# Patient Record
Sex: Female | Born: 2000 | Race: White | Hispanic: No | Marital: Single | Smoking: Never smoker
Health system: Southern US, Community
[De-identification: ages and names within clinical notes are randomized; demographics above are authoritative.]

## PROBLEM LIST (undated history)

## (undated) HISTORY — PX: APPENDECTOMY: SHX54

---

## 2018-04-16 ENCOUNTER — Emergency Department
Admission: EM | Admit: 2018-04-16 | Discharge: 2018-04-16 | Disposition: A | Discharge status: Home / Self Care | Payer: 59 | Attending: Emergency Medicine | Admitting: Emergency Medicine

## 2018-04-16 ENCOUNTER — Other Ambulatory Visit: Payer: Self-pay

## 2018-04-16 ENCOUNTER — Encounter: Payer: Self-pay | Admitting: Emergency Medicine

## 2018-04-16 ENCOUNTER — Emergency Department: Payer: 59

## 2018-04-16 DIAGNOSIS — R569 Unspecified convulsions: Secondary | ICD-10-CM | POA: Insufficient documentation

## 2018-04-16 LAB — CBC WITH DIFFERENTIAL/PLATELET
Abs Immature Granulocytes: 0.03 10*3/uL (ref 0.00–0.07)
Basophils Absolute: 0 10*3/uL (ref 0.0–0.1)
Basophils Relative: 1 %
Eosinophils Absolute: 0.1 10*3/uL (ref 0.0–0.5)
Eosinophils Relative: 1 %
HEMATOCRIT: 43 % (ref 36.0–46.0)
Hemoglobin: 15 g/dL (ref 12.0–15.0)
Immature Granulocytes: 1 %
Lymphocytes Relative: 30 %
Lymphs Abs: 1.7 10*3/uL (ref 0.7–4.0)
MCH: 30.5 pg (ref 26.0–34.0)
MCHC: 34.9 g/dL (ref 30.0–36.0)
MCV: 87.6 fL (ref 80.0–100.0)
Monocytes Absolute: 0.4 10*3/uL (ref 0.1–1.0)
Monocytes Relative: 7 %
NEUTROS ABS: 3.4 10*3/uL (ref 1.7–7.7)
Neutrophils Relative %: 60 %
Platelets: 227 10*3/uL (ref 150–400)
RBC: 4.91 MIL/uL (ref 3.87–5.11)
RDW: 11.6 % (ref 11.5–15.5)
WBC: 5.6 10*3/uL (ref 4.0–10.5)
nRBC: 0 % (ref 0.0–0.2)

## 2018-04-16 LAB — URINE DRUG SCREEN, QUALITATIVE (ARMC ONLY)
AMPHETAMINES, UR SCREEN: NOT DETECTED
Barbiturates, Ur Screen: NOT DETECTED
Benzodiazepine, Ur Scrn: NOT DETECTED
Cannabinoid 50 Ng, Ur ~~LOC~~: NOT DETECTED
Cocaine Metabolite,Ur ~~LOC~~: NOT DETECTED
MDMA (Ecstasy)Ur Screen: NOT DETECTED
METHADONE SCREEN, URINE: NOT DETECTED
Opiate, Ur Screen: NOT DETECTED
Phencyclidine (PCP) Ur S: NOT DETECTED
TRICYCLIC, UR SCREEN: NOT DETECTED

## 2018-04-16 LAB — COMPREHENSIVE METABOLIC PANEL
ALT: 54 U/L — ABNORMAL HIGH (ref 0–44)
AST: 27 U/L (ref 15–41)
Albumin: 4.6 g/dL (ref 3.5–5.0)
Alkaline Phosphatase: 53 U/L (ref 38–126)
Anion gap: 8 (ref 5–15)
BUN: 19 mg/dL (ref 6–20)
CO2: 26 mmol/L (ref 22–32)
Calcium: 9.7 mg/dL (ref 8.9–10.3)
Chloride: 103 mmol/L (ref 98–111)
Creatinine, Ser: 0.99 mg/dL (ref 0.44–1.00)
GFR calc Af Amer: >60 mL/min (ref >60)
GFR calc non Af Amer: >60 mL/min (ref >60)
Glucose, Bld: 108 mg/dL — ABNORMAL HIGH (ref 70–99)
Potassium: 3.8 mmol/L (ref 3.5–5.1)
Sodium: 137 mmol/L (ref 135–145)
Total Bilirubin: 1.2 mg/dL (ref 0.3–1.2)
Total Protein: 8.1 g/dL (ref 6.5–8.1)

## 2018-04-16 LAB — URINALYSIS, COMPLETE (UACMP) WITH MICROSCOPIC
Bilirubin Urine: NEGATIVE
Glucose, UA: NEGATIVE mg/dL
Hgb urine dipstick: NEGATIVE
Ketones, ur: 20 mg/dL — AB
Leukocytes,Ua: NEGATIVE
Nitrite: NEGATIVE
Protein, ur: NEGATIVE mg/dL
Specific Gravity, Urine: 1.016 (ref 1.005–1.030)
pH: 6 (ref 5.0–8.0)

## 2018-04-16 LAB — POCT PREGNANCY, URINE: Preg Test, Ur: NEGATIVE

## 2018-04-16 LAB — ETHANOL: Alcohol, Ethyl (B): <10 mg/dL (ref <10)

## 2018-04-16 MED ORDER — SODIUM CHLORIDE 0.9 % IV SOLN
Freq: Once | INTRAVENOUS | Status: AC
  Administered 2018-04-16: 10:00:00 via INTRAVENOUS

## 2018-04-16 NOTE — ED Notes (Signed)
Pt unsuccessful to obtain urine specimen at this time. Will continue to monitor for sample

## 2018-04-16 NOTE — ED Triage Notes (Signed)
PT via EMS from [place], states witnessed seizure. No hx. VSS. PT A&OX4, MD at bedside

## 2018-04-16 NOTE — ED Provider Notes (Signed)
[facility] Emergency Department Provider Note       Time seen: ----------------------------------------- 9:47 AM on 04/16/2018 -----------------------------------------   I have reviewed the triage vital signs and the nursing notes.  HISTORY   Chief Complaint Seizures    HPI Jean Price is a 18 y.o. female with no significant past medical history who presents to the ED for a seizure.  EMS reports it was related that she seized for about 20 minutes.  She was not incontinent, did not bite her tongue.  She has never had seizures before.  She denies any recent illness.  Reports she has had poor sleep recently.  History reviewed. No pertinent past medical history.  There are no active problems to display for this patient.   Past Surgical History:  Procedure Laterality Date  . APPENDECTOMY      Allergies Patient has no known allergies.  Social History Social History   Tobacco Use  . Smoking status: Never Smoker  . Smokeless tobacco: Never Used  Substance Use Topics  . Alcohol use: Never    Frequency: Never  . Drug use: Never   Review of Systems Constitutional: Negative for fever. Cardiovascular: Negative for chest pain. Respiratory: Negative for shortness of breath. Gastrointestinal: Negative for abdominal pain, vomiting and diarrhea. Musculoskeletal: Negative for back pain. Skin: Negative for rash. Neurological: Positive for seizure  All systems negative/normal/unremarkable except as stated in the HPI  ____________________________________________   PHYSICAL EXAM:  VITAL SIGNS: ED Triage Vitals  Enc Vitals Group     BP 04/16/18 0942 (!) 147/95     Pulse Rate 04/16/18 0942 (!) 55     Resp 04/16/18 0942 16     Temp 04/16/18 0942 97.8 F (36.6 C)     Temp Source 04/16/18 0942 Oral     SpO2 04/16/18 0942 100 %     Weight --      Height --      Head Circumference --      Peak Flow --      Pain Score 04/16/18 0943 0     Pain  Loc --      Pain Edu? --      Excl. in GC? --    Constitutional: Alert and oriented. Well appearing and in no distress. Eyes: Conjunctivae are normal. Normal extraocular movements. ENT      Head: Normocephalic and atraumatic.      Nose: No congestion/rhinnorhea.      Mouth/Throat: Mucous membranes are moist.  No trauma visualized      Neck: No stridor. Cardiovascular: Normal rate, regular rhythm. No murmurs, rubs, or gallops. Respiratory: Normal respiratory effort without tachypnea nor retractions. Breath sounds are clear and equal bilaterally. No wheezes/rales/rhonchi. Gastrointestinal: Soft and nontender. Normal bowel sounds Musculoskeletal: Nontender with normal range of motion in extremities. No lower extremity tenderness nor edema. Neurologic:  Normal speech and language. No gross focal neurologic deficits are appreciated.  Skin:  Skin is warm, dry and intact. No rash noted. Psychiatric: Mood and affect are normal. Speech and behavior are normal.  ____________________________________________  EKG: Interpreted by me.  Sinus rhythm the rate is 58 bpm, normal PR interval, normal QRS, normal QT  ____________________________________________  ED COURSE:  As part of my medical decision making, I reviewed the following data within the electronic MEDICAL RECORD NUMBER History obtained from family if available, nursing notes, old chart and ekg, as well as notes from prior ED visits. Patient presented for seizure, we will assess with labs  and imaging as indicated at this time.   Procedures ____________________________________________   LABS (pertinent positives/negatives)  Labs Reviewed  COMPREHENSIVE METABOLIC PANEL - Abnormal; Notable for the following components:      Result Value   Glucose, Bld 108 (*)    ALT 54 (*)    All other components within normal limits  CBC WITH DIFFERENTIAL/PLATELET  ETHANOL  URINALYSIS, COMPLETE (UACMP) WITH MICROSCOPIC  URINE DRUG SCREEN, QUALITATIVE  ([facility] ONLY)  POCT PREGNANCY, URINE  CBG MONITORING, ED  POC URINE PREG, ED    RADIOLOGY Images were viewed by me  CT head IMPRESSION: Study within normal limits. ____________________________________________   DIFFERENTIAL DIAGNOSIS   Seizure, arrhythmia, pregnancy, dehydration, electrolyte abnormality, substance abuse  FINAL ASSESSMENT AND PLAN  Seizure   Plan: The patient had presented for new onset seizure. Patient's labs did not reveal any acute process. Patient's imaging was also reassuring.  Seizure was likely multifactorial.  She looks well and is neurologically intact.  She is cleared for outpatient follow-up.   Ulice Dash, MD    Note: This note was generated in part or whole with voice recognition software. Voice recognition is usually quite accurate but there are transcription errors that can and very often do occur. I apologize for any typographical errors that were not detected and corrected.     Emily Filbert, MD 04/16/18 1228

## 2018-04-16 NOTE — ED Notes (Signed)
Patient transported to CT 

## 2018-04-18 ENCOUNTER — Ambulatory Visit (INDEPENDENT_AMBULATORY_CARE_PROVIDER_SITE_OTHER): Payer: Commercial Managed Care - PPO | Admitting: Family Medicine

## 2018-04-18 VITALS — BP 139/86 | HR 46 | Temp 97.5°F | Resp 14

## 2018-04-18 DIAGNOSIS — R55 Syncope and collapse: Secondary | ICD-10-CM

## 2018-04-18 DIAGNOSIS — R569 Unspecified convulsions: Secondary | ICD-10-CM

## 2018-04-19 ENCOUNTER — Other Ambulatory Visit: Payer: Self-pay | Admitting: Family Medicine

## 2018-04-19 DIAGNOSIS — R55 Syncope and collapse: Secondary | ICD-10-CM

## 2018-04-19 DIAGNOSIS — R569 Unspecified convulsions: Secondary | ICD-10-CM

## 2018-05-01 NOTE — Progress Notes (Signed)
Cardiology Office Note   Date:  05/02/2018   ID:  Jean Price, DOB 2000/04/03, MRN [id number]  PCP:  Patient, No Pcp Per  Cardiologist:  Lorine Bears, MD  Chief Complaint  Patient presents with  . other    Ref by Dr. Allena Katz for seizure like activity and syncope, the patient is on a softball scholarship at [employer]. Meds reviewed by the pt. verbally. "doing well."       History of Present Illness: Jean Price is a 18 y.o. female who is referred by Dr. Jolene Provost, MD for seizure-like activity and syncope. She has no significant past medical history. She presented to the ED on 04/16/2018 for a seizure that lasted about 20 minutes. Her labs did not reveal any acute process and her imaging was reassuring.  The patient is doing well today. She has no prior history of seizures. During class in the morning she felt sick, chest tightness, and then blacked which caused her to fall and hit her head. The seizure happened only for a few seconds. She was confused afterwards, but was aware of what had just occurred. She felt nauseous after the episode and vomited a couple of times. After the seizure she has been fine. She was not put on any seizure medication by her provider until further examination. She has no difficulty exercising for softball. Her mother has epilepsy. She has been on spironolactone for 3 months and is on oral contraceptive. Non-smoker and drinks alcohol occasionally. She has never had any cardiac tests done. She denies chest pain, SOB, palpitations or any other related symptoms or complaints at this time.  She was seen by Dr. Sherryll Burger yesterday and is suspected of having complex partial seizure with secondary generalization.  She is supposed to get an EEG and brain MRI.   History reviewed. No pertinent past medical history.  Past Surgical History:  Procedure Laterality Date  . APPENDECTOMY       Current Outpatient Medications  Medication Sig Dispense Refill  . LO  LOESTRIN FE 1 MG-10 MCG / 10 MCG tablet Take 1 tablet by mouth daily.    Marland Kitchen spironolactone (ALDACTONE) 50 MG tablet Take 50 mg by mouth 2 (two) times daily.     No current facility-administered medications for this visit.     Allergies:   Patient has no known allergies.    Social History:  The patient  reports that she has never smoked. She has never used smokeless tobacco. She reports current alcohol use. She reports that she does not use drugs.   Family History:  The patient's family history is not on file.    ROS:  Please see the history of present illness.   Otherwise, review of systems are positive for none.   All other systems are reviewed and negative.    PHYSICAL EXAM: VS:  BP 110/72 (BP Location: Right Arm, Patient Position: Sitting, Cuff Size: Normal)   Pulse 60   Ht 5\' 3"  (1.6 m)   Wt 135 lb 12 oz (61.6 kg)   SpO2 98%   BMI 24.05 kg/m  , BMI Body mass index is 24.05 kg/m. GEN: Well nourished, well developed, in no acute distress  HEENT: normal  Neck: no JVD, carotid bruits, or masses Cardiac: RRR; no murmurs, rubs, or gallops,no edema  Respiratory:  clear to auscultation bilaterally, normal work of breathing GI: soft, nontender, nondistended, + BS MS: no deformity or atrophy  Skin: warm and dry, no rash Neuro:  Strength  and sensation are intact Psych: euthymic mood, full affect   EKG:  EKG is ordered today. The ekg ordered today demonstrates normal sinus rhythm with no significant ST or T wave changes.  Normal PR and QT interval.   Recent Labs: 04/16/2018: ALT 54; BUN 19; Creatinine, Ser 0.99; Hemoglobin 15.0; Platelets 227; Potassium 3.8; Sodium 137    Lipid Panel No results found for: CHOL, TRIG, HDL, CHOLHDL, VLDL, LDLCALC, LDLDIRECT    Wt Readings from Last 3 Encounters:  05/02/18 135 lb 12 oz (61.6 kg)       ASSESSMENT AND PLAN:  1.  Loss of consciousness: All the indications point towards seizures and not a true cardiac syncope.  Her cardiac  exam is normal and baseline EKG is unremarkable.  There is a very small chance of arrhythmia causing seizure-like activities.  Due to that, I am going to obtain a 2-week outpatient monitor.  Given the normal cardiac exam and EKG, no need for an echocardiogram.  2.  Possible seizures: Continue follow-up with Dr. Sherryll Burger as planned.  3.  I do not see any reason to restrict the patient's activities or participation in sports.  She is cleared from a cardiac standpoint to resume.     Disposition:   FU with me in PRN  I, Jesus Reyes am acting as a Neurosurgeon for Lorine Bears, M.D.  I have reviewed the above documentation for accuracy and completeness, and I agree with the above.   Signed, Lorine Bears, MD 05/02/18 [facility] [place], [place] [phone]

## 2018-05-02 ENCOUNTER — Encounter: Payer: Self-pay | Admitting: Cardiovascular Disease

## 2018-05-02 ENCOUNTER — Ambulatory Visit (INDEPENDENT_AMBULATORY_CARE_PROVIDER_SITE_OTHER): Payer: 59 | Admitting: Cardiovascular Disease

## 2018-05-02 ENCOUNTER — Ambulatory Visit (INDEPENDENT_AMBULATORY_CARE_PROVIDER_SITE_OTHER): Payer: 59

## 2018-05-02 ENCOUNTER — Other Ambulatory Visit: Payer: Self-pay

## 2018-05-02 ENCOUNTER — Encounter

## 2018-05-02 VITALS — BP 110/72 | HR 60 | Ht 63.0 in | Wt 135.8 lb

## 2018-05-02 DIAGNOSIS — R55 Syncope and collapse: Secondary | ICD-10-CM

## 2018-05-02 NOTE — Patient Instructions (Addendum)
Medication Instructions:  No changes If you need a refill on your cardiac medications before your next appointment, please call your pharmacy.   Lab work: None ordered  Testing/Procedures: Your physician has recommended that you wear a 14 day Zio AT event monitor. Event monitors are medical devices that record the heart's electrical activity. Doctors most often [place] these monitors to diagnose arrhythmias. Arrhythmias are problems with the speed or rhythm of the heartbeat. The monitor is a small, portable device. You can wear one while you do your normal daily activities. This is usually used to diagnose what is causing palpitations/syncope (passing out).  This will be mailed to your home address within a few days. Please call us at [phone] if you have not heard from them.    Follow-Up: Follow up as needed with Dr. Kirke Corin.

## 2018-08-07 NOTE — Progress Notes (Signed)
Patient presents with symptoms of a syncopal episode while seated during class on Tuesday. Apparently, there was seizure-like movement for several movements when she was on the ground. She denies loss of bowel/bladder during the episode. She denies any history of syncope or seizures in the past. She denies any recent head injury. Does have a family history of epilepsy. Denies illicit drug use or alcohol use. Denies any cardiac problems in the past. No sudden cardiac death in the family. Patient does admit that she has felt some fatigue since the event. Denies any significant headache, nausea, vomiting.  Patient was seen in the ER an a CT head and labs were done. These were reviewed today.   ROS: Negative except mentioned above. Vitals as per Epic.  GENERAL: NAD HEENT: no pharyngeal erythema, no exudate, no erythema of TMs, no cervical LAD RESP: CTA B CARD: RRR, no m/r/g appreciated  NEURO: CN II-XII grossly intact, -Rombergs  A/P: Syncopal Episode vs Seizure - would recommend cardiac and neurology evaluation, encouraged patient not to drive for now given possible seizure, no athletic activity for now, discussed plan with trainer, seek medical attention if any persistent or worsening symptoms as discussed.

## 2018-11-13 ENCOUNTER — Other Ambulatory Visit: Payer: Self-pay

## 2018-11-13 DIAGNOSIS — Z20822 Contact with and (suspected) exposure to covid-19: Secondary | ICD-10-CM

## 2018-11-15 LAB — NOVEL CORONAVIRUS, NAA: SARS-CoV-2, NAA: NOT DETECTED

## 2020-03-16 IMAGING — CT CT HEAD W/O CM
3 series · 16 of 47 positions shown, 19 images · non-contrast
Comparison: None.

CLINICAL DATA: Syncope with fall.  Questionable seizure

EXAM:
CT HEAD WITHOUT CONTRAST
TECHNIQUE: Contiguous axial images were obtained from the base of the skull
through the vertex without intravenous contrast.

[Series 2: head wo · axial · 0.42mm/px · z∈[+446,+571]mm · 10 of 30 slices shown, 13 images]
[im 3/30  brain]
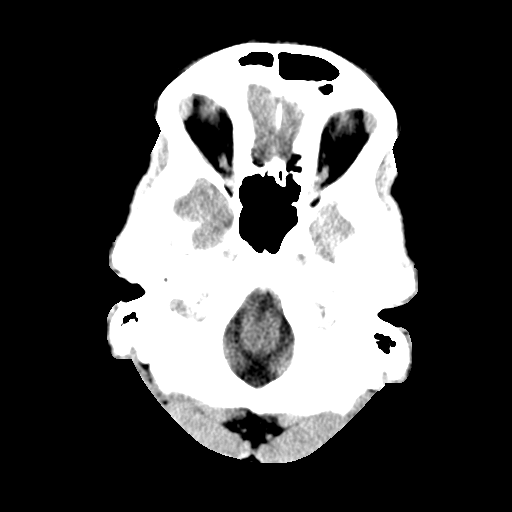
[im 3/30  bone]
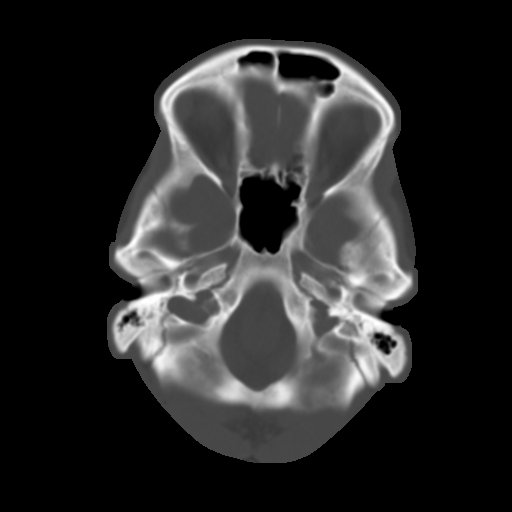
[im 6/30  brain]
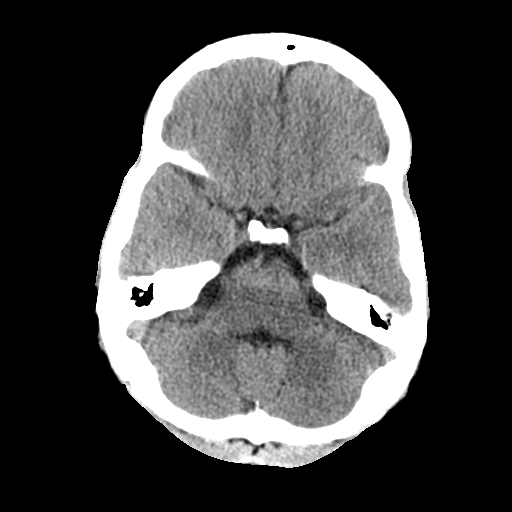
[im 9/30  brain]
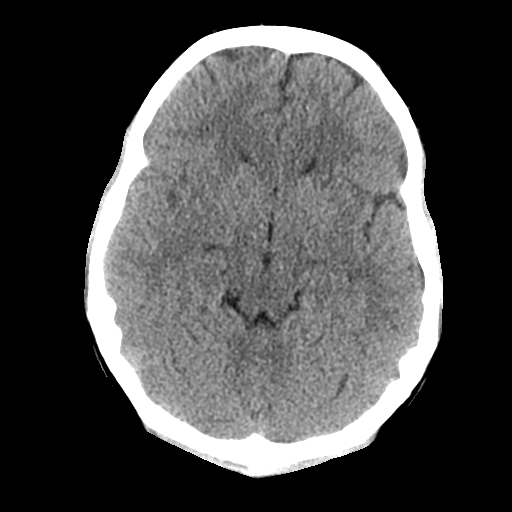
[im 11/30  brain]
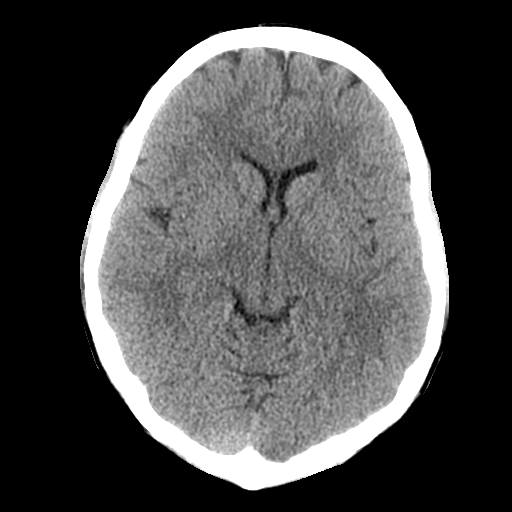
[im 14/30  brain]
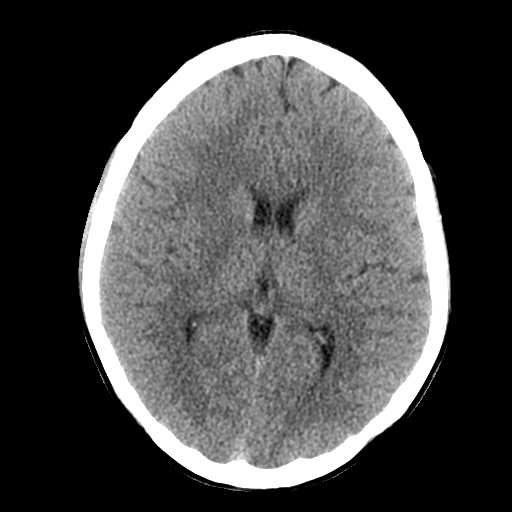
[im 14/30  bone]
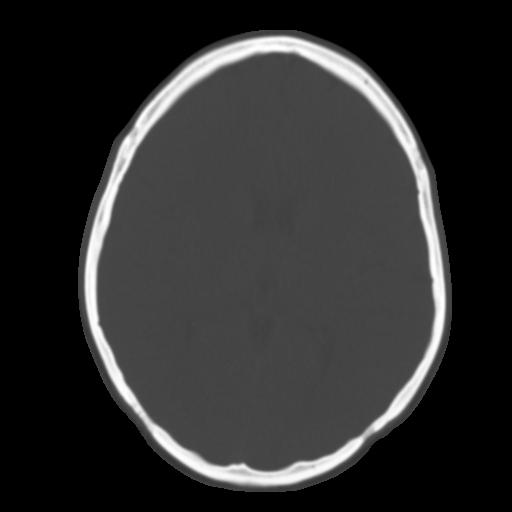
[im 17/30  brain]
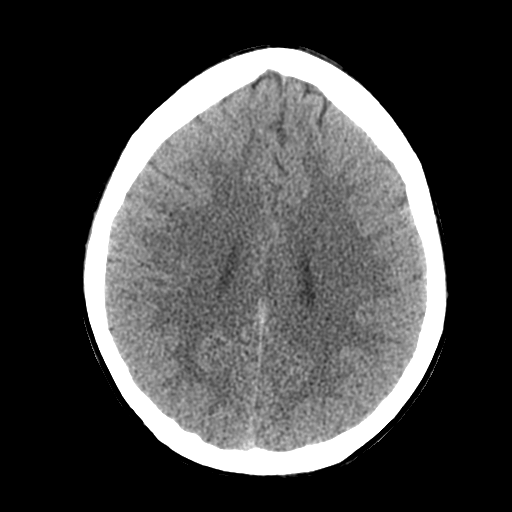
[im 20/30  brain]
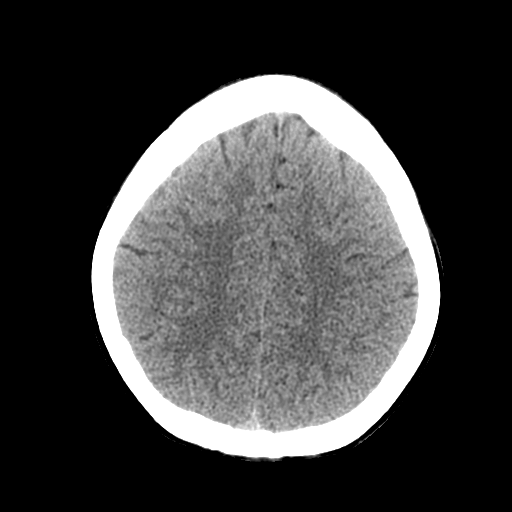
[im 23/30  brain]
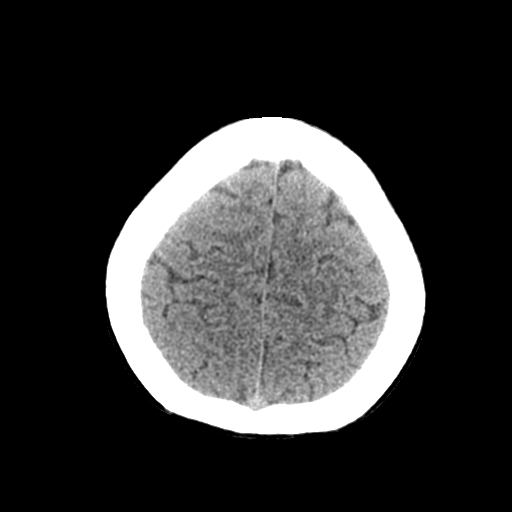
[im 25/30  brain]
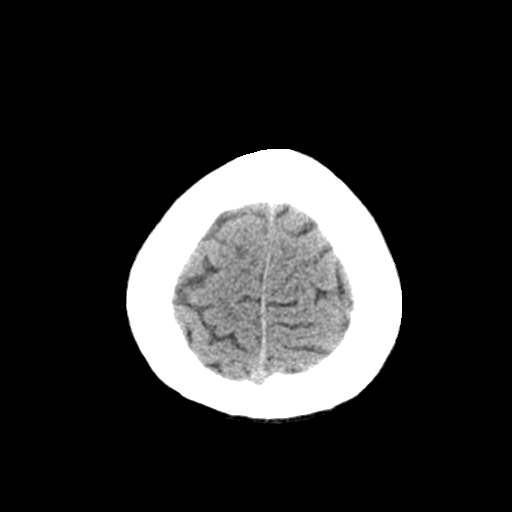
[im 25/30  bone]
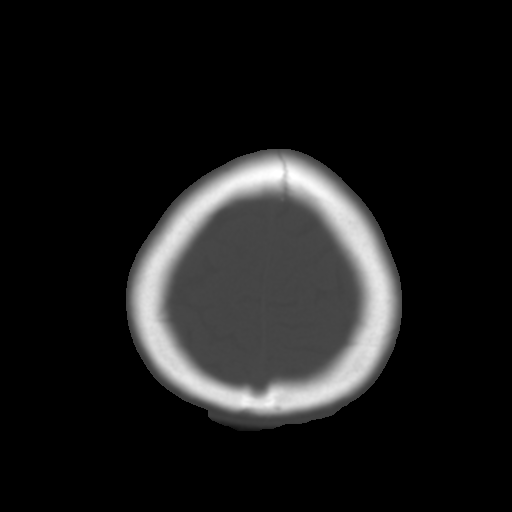
[im 28/30  brain]
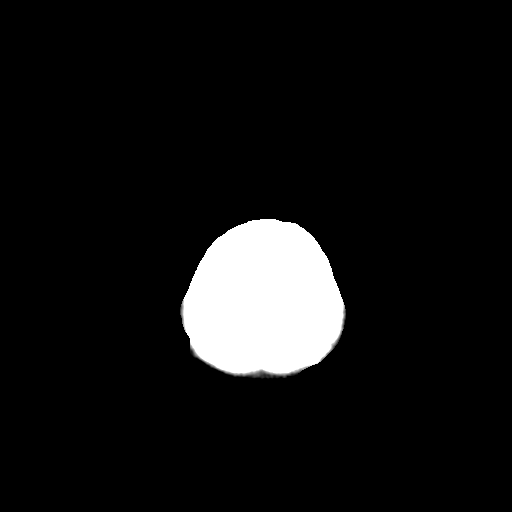

[Series 4: coronal soft tissue · coronal · 0.31mm/px · 3 of 67 slices shown]
[im 23/67  brain]
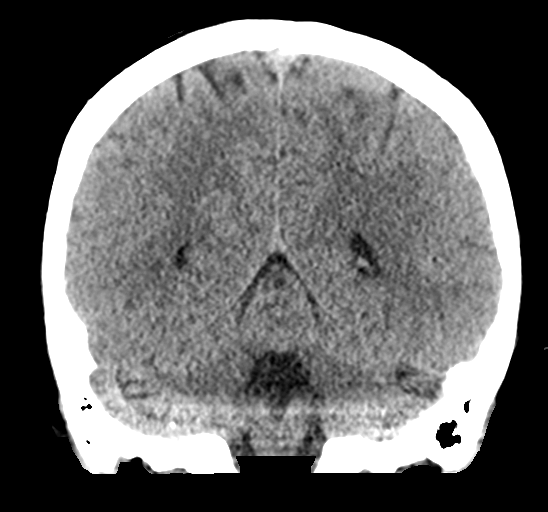
[im 30/67  brain]
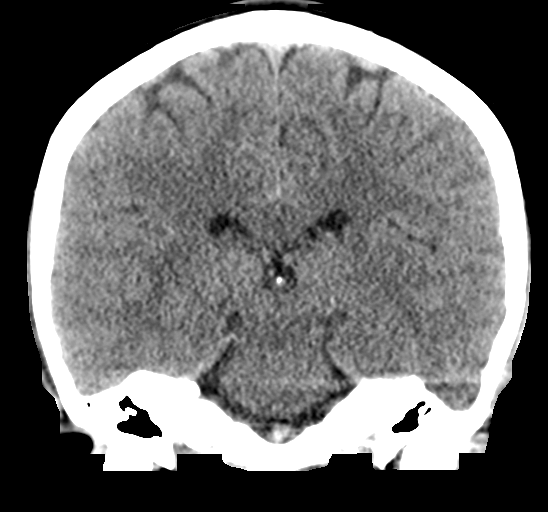
[im 37/67  brain]
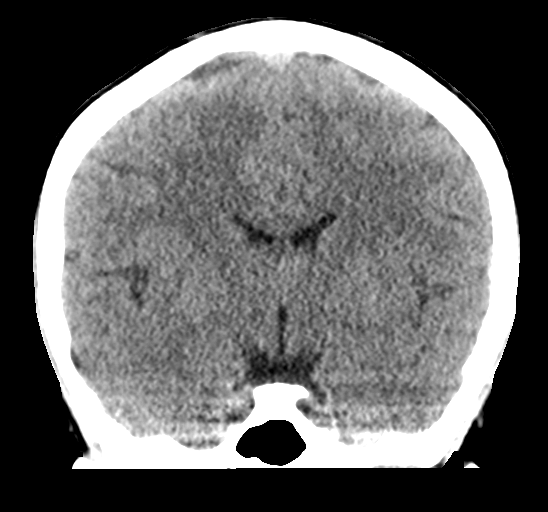

[Series 5: sagittal soft tissue · sagittal · 0.31mm/px · 3 of 54 slices shown]
[im 18/54  brain]
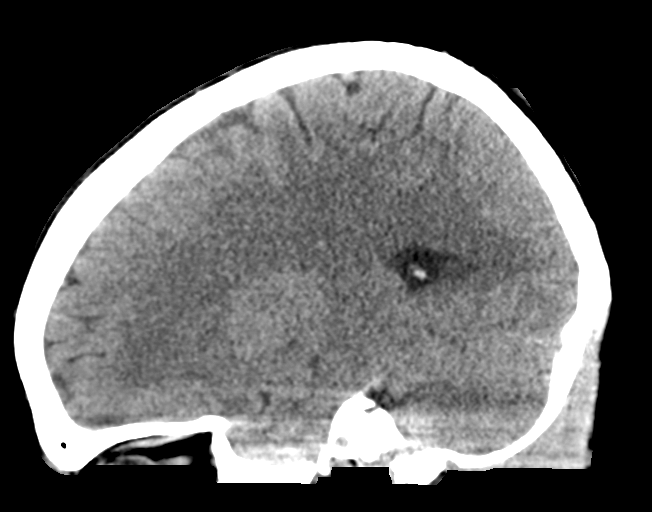
[im 27/54  brain]
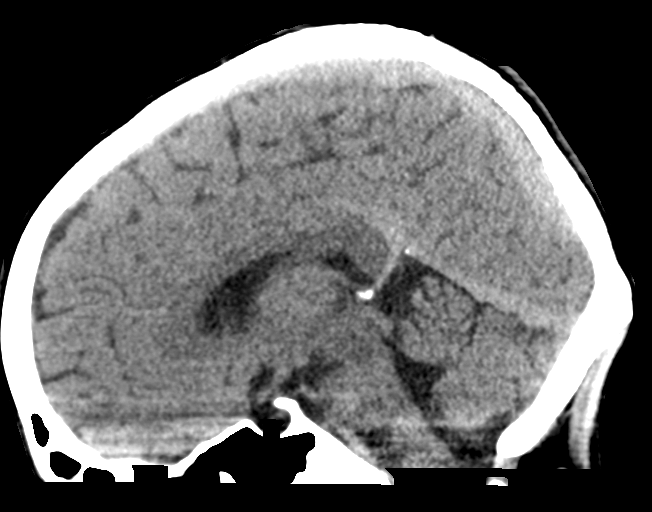
[im 36/54  brain]
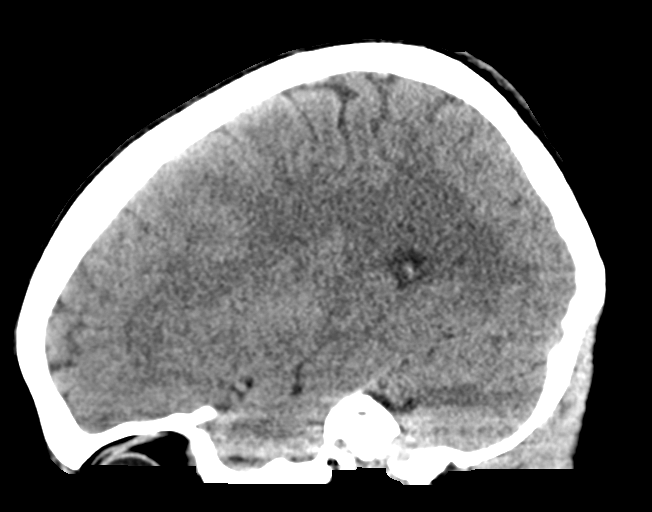

[16 of 47 positions shown; findings below may reference images not displayed]

FINDINGS: Brain: The ventricles are normal in size and configuration. There is
no intracranial mass, hemorrhage, extra-axial fluid collection, or
midline shift. The brain parenchyma appears unremarkable. No acute
infarct evident.

Vascular: No hyperdense vessel. No vascular lesions are appreciable.

Skull: Bony calvarium appears intact.

Sinuses/Orbits: Visualized paranasal sinuses are clear. Visualized
orbits appear symmetric bilaterally.

Other: Mastoid air cells are clear.
IMPRESSION: Study within normal limits.
